# Patient Record
Sex: Female | Born: 1995 | Race: Black or African American | Hispanic: No | Marital: Single | Smoking: Never smoker
Health system: Southern US, Community
[De-identification: ages and names within clinical notes are randomized; demographics above are authoritative.]

## PROBLEM LIST (undated history)

## (undated) DIAGNOSIS — Z789 Other specified health status: Secondary | ICD-10-CM

---

## 2018-02-25 ENCOUNTER — Emergency Department (HOSPITAL_COMMUNITY)
Admission: EM | Admit: 2018-02-25 | Discharge: 2018-02-26 | Disposition: A | Discharge status: Home / Self Care | Payer: 59 | Source: Home / Self Care | Attending: Emergency Medicine | Admitting: Emergency Medicine

## 2018-02-25 ENCOUNTER — Emergency Department (HOSPITAL_COMMUNITY): Payer: 59

## 2018-02-25 ENCOUNTER — Other Ambulatory Visit: Payer: Self-pay

## 2018-02-25 ENCOUNTER — Encounter (HOSPITAL_COMMUNITY): Payer: Self-pay | Admitting: Emergency Medicine

## 2018-02-25 DIAGNOSIS — R109 Unspecified abdominal pain: Secondary | ICD-10-CM | POA: Insufficient documentation

## 2018-02-25 DIAGNOSIS — O209 Hemorrhage in early pregnancy, unspecified: Secondary | ICD-10-CM

## 2018-02-25 DIAGNOSIS — Z3A01 Less than 8 weeks gestation of pregnancy: Secondary | ICD-10-CM | POA: Insufficient documentation

## 2018-02-25 DIAGNOSIS — Q928 Other specified trisomies and partial trisomies of autosomes: Secondary | ICD-10-CM

## 2018-02-25 DIAGNOSIS — O021 Missed abortion: Secondary | ICD-10-CM | POA: Insufficient documentation

## 2018-02-25 DIAGNOSIS — O4691 Antepartum hemorrhage, unspecified, first trimester: Secondary | ICD-10-CM | POA: Diagnosis present

## 2018-02-25 DIAGNOSIS — O4690 Antepartum hemorrhage, unspecified, unspecified trimester: Secondary | ICD-10-CM

## 2018-02-25 LAB — WET PREP, GENITAL
Clue Cells Wet Prep HPF POC: NONE SEEN
Sperm: NONE SEEN
Trich, Wet Prep: NONE SEEN
Yeast Wet Prep HPF POC: NONE SEEN

## 2018-02-25 LAB — COMPREHENSIVE METABOLIC PANEL
ALT: 13 U/L (ref 0–44)
AST: 19 U/L (ref 15–41)
Albumin: 4 g/dL (ref 3.5–5.0)
Alkaline Phosphatase: 50 U/L (ref 38–126)
Anion gap: 9 (ref 5–15)
BILIRUBIN TOTAL: 0.3 mg/dL (ref 0.3–1.2)
BUN: 5 mg/dL — ABNORMAL LOW (ref 6–20)
CHLORIDE: 103 mmol/L (ref 98–111)
CO2: 24 mmol/L (ref 22–32)
CREATININE: 0.8 mg/dL (ref 0.44–1.00)
Calcium: 9.2 mg/dL (ref 8.9–10.3)
GFR calc Af Amer: >60 mL/min (ref >60)
Glucose, Bld: 84 mg/dL (ref 70–99)
Potassium: 3.9 mmol/L (ref 3.5–5.1)
Sodium: 136 mmol/L (ref 135–145)
TOTAL PROTEIN: 7.5 g/dL (ref 6.5–8.1)

## 2018-02-25 LAB — CBC WITH DIFFERENTIAL/PLATELET
ABS IMMATURE GRANULOCYTES: 0.01 10*3/uL (ref 0.00–0.07)
Basophils Absolute: 0 10*3/uL (ref 0.0–0.1)
Basophils Relative: 1 %
EOS PCT: 5 %
Eosinophils Absolute: 0.3 10*3/uL (ref 0.0–0.5)
HEMATOCRIT: 40.5 % (ref 36.0–46.0)
Hemoglobin: 12.8 g/dL (ref 12.0–15.0)
IMMATURE GRANULOCYTES: 0 %
LYMPHS PCT: 42 %
Lymphs Abs: 2.4 10*3/uL (ref 0.7–4.0)
MCH: 28.4 pg (ref 26.0–34.0)
MCHC: 31.6 g/dL (ref 30.0–36.0)
MCV: 90 fL (ref 80.0–100.0)
MONO ABS: 0.6 10*3/uL (ref 0.1–1.0)
Monocytes Relative: 10 %
Neutro Abs: 2.5 10*3/uL (ref 1.7–7.7)
Neutrophils Relative %: 42 %
Platelets: 255 10*3/uL (ref 150–400)
RBC: 4.5 MIL/uL (ref 3.87–5.11)
RDW: 12.4 % (ref 11.5–15.5)
WBC: 5.9 10*3/uL (ref 4.0–10.5)
nRBC: 0 % (ref 0.0–0.2)

## 2018-02-25 LAB — HCG, QUANTITATIVE, PREGNANCY: HCG, BETA CHAIN, QUANT, S: 13380 m[IU]/mL — AB (ref <5)

## 2018-02-25 NOTE — ED Provider Notes (Signed)
[facility] EMERGENCY DEPARTMENT Provider Note   CSN: [id number] Arrival date & time: 02/25/18  1853     History   Chief Complaint Chief Complaint  Patient presents with  . Vaginal Bleeding    HPI Jeanette [name] is a 22 y.o. female who is currently [redacted] weeks pregnant by menstrual cycle, who presents to ED for evaluation of abdominal cramping, vaginal bleeding for the past 5 days.  Patient was seen and evaluated on 10/11 at an outpatient clinic which confirmed her pregnancy with ultrasound.  She was told to follow-up with an OB/GYN at her 8-week mark.  However, since Monday she has had persistent vaginal bleeding, heavier than her usual..  She states that this is her first pregnancy.  Denies any anticoagulation, vomiting, fever, dysuria.    HPI  History reviewed. No pertinent past medical history.  There are no active problems to display for this patient.   History reviewed. No pertinent surgical history.   OB History    Gravida  1   Para      Term      Preterm      AB      Living        SAB      TAB      Ectopic      Multiple      Live Births               Home Medications    Prior to Admission medications   Medication Sig Start Date End Date Taking? Authorizing Provider  HYDROcodone-acetaminophen (NORCO/VICODIN) 5-325 MG tablet Take 1 tablet by mouth every 6 (six) hours as needed. 02/26/18   Dietrich Pates, PA-C    Family History No family history on file.  Social History Social History   Tobacco Use  . Smoking status: Never Smoker  . Smokeless tobacco: Never Used  Substance Use Topics  . Alcohol use: Never    Frequency: Never  . Drug use: Never     Allergies   Patient has no allergy information on record.   Review of Systems Review of Systems  Constitutional: Negative for appetite change, chills and fever.  HENT: Negative for ear pain, rhinorrhea, sneezing and sore throat.   Eyes: Negative for  photophobia and visual disturbance.  Respiratory: Negative for cough, chest tightness, shortness of breath and wheezing.   Cardiovascular: Negative for chest pain and palpitations.  Gastrointestinal: Positive for abdominal pain. Negative for blood in stool, constipation, diarrhea, nausea and vomiting.  Genitourinary: Positive for vaginal bleeding. Negative for dysuria, hematuria and urgency.  Musculoskeletal: Negative for myalgias.  Skin: Negative for rash.  Neurological: Negative for dizziness, weakness and light-headedness.     Physical Exam Updated Vital Signs BP 123/80 (BP Location: Right Arm)   Pulse 62   Temp 98.8 F (37.1 C) (Oral)   Resp 16   Ht 5\' 5"  (1.651 m)   Wt 68 kg   LMP 12/24/2017 (Approximate)   SpO2 100%   BMI 24.96 kg/m   Physical Exam  Constitutional: She appears well-developed and well-nourished. No distress.  HENT:  Head: Normocephalic and atraumatic.  Nose: Nose normal.  Eyes: Conjunctivae and EOM are normal. Left eye exhibits no discharge. No scleral icterus.  Neck: Normal range of motion. Neck supple.  Cardiovascular: Normal rate, regular rhythm, normal heart sounds and intact distal pulses. Exam reveals no gallop and no friction rub.  No murmur heard. Pulmonary/Chest: Effort normal and breath sounds normal. No  respiratory distress.  Abdominal: Soft. Bowel sounds are normal. She exhibits no distension. There is no tenderness. There is no guarding.  Genitourinary: There is bleeding in the vagina.  Genitourinary Comments: Pelvic exam: normal external genitalia without evidence of trauma. VULVA: normal appearing vulva with no masses, tenderness or lesion. VAGINA: normal appearing vagina with dark red blood in the vaginal vault. CERVIX: normal appearing cervix without lesions, cervical motion tenderness absent, cervical os closed with out purulent discharge; No vaginal discharge. Wet prep and DNA probe for chlamydia and GC obtained.   ADNEXA: normal  adnexa in size, nontender and no masses UTERUS: uterus is normal size, shape, consistency and nontender.    Musculoskeletal: Normal range of motion. She exhibits no edema.  Neurological: She is alert. She exhibits normal muscle tone. Coordination normal.  Skin: Skin is warm and dry. No rash noted.  Psychiatric: She has a normal mood and affect.  Nursing note and vitals reviewed.    ED Treatments / Results  Labs (all labs ordered are listed, but only abnormal results are displayed) Labs Reviewed  WET PREP, GENITAL - Abnormal; Notable for the following components:      Result Value   WBC, Wet Prep HPF POC RARE (*)    All other components within normal limits  COMPREHENSIVE METABOLIC PANEL - Abnormal; Notable for the following components:   BUN 5 (*)    All other components within normal limits  HCG, QUANTITATIVE, PREGNANCY - Abnormal; Notable for the following components:   hCG, Beta Chain, Quant, S 13,380 (*)    All other components within normal limits  URINALYSIS, ROUTINE W REFLEX MICROSCOPIC - Abnormal; Notable for the following components:   Color, Urine RED (*)    APPearance TURBID (*)    Glucose, UA   (*)    Value: TEST NOT REPORTED DUE TO COLOR INTERFERENCE OF URINE PIGMENT   Hgb urine dipstick   (*)    Value: TEST NOT REPORTED DUE TO COLOR INTERFERENCE OF URINE PIGMENT   Bilirubin Urine   (*)    Value: TEST NOT REPORTED DUE TO COLOR INTERFERENCE OF URINE PIGMENT   Ketones, ur   (*)    Value: TEST NOT REPORTED DUE TO COLOR INTERFERENCE OF URINE PIGMENT   Protein, ur   (*)    Value: TEST NOT REPORTED DUE TO COLOR INTERFERENCE OF URINE PIGMENT   Nitrite   (*)    Value: TEST NOT REPORTED DUE TO COLOR INTERFERENCE OF URINE PIGMENT   Leukocytes, UA   (*)    Value: TEST NOT REPORTED DUE TO COLOR INTERFERENCE OF URINE PIGMENT   All other components within normal limits  URINE CULTURE  CBC WITH DIFFERENTIAL/PLATELET  URINALYSIS, MICROSCOPIC (REFLEX)  ABO/RH  TYPE AND  SCREEN  GC/CHLAMYDIA PROBE AMP ([place]) NOT AT [facility]    EKG None  Radiology US Ob Less Than 14 Weeks With Ob Transvaginal  Result Date: 02/25/2018 CLINICAL DATA:  Vaginal bleeding with cramping EXAM: OBSTETRIC <14 WK [place] AND TRANSVAGINAL OB US TECHNIQUE: Both transabdominal and transvaginal ultrasound examinations were performed for complete evaluation of the gestation as well as the maternal uterus, adnexal regions, and pelvic cul-de-sac. Transvaginal technique was performed to assess early pregnancy. COMPARISON:  None. FINDINGS: Intrauterine gestational sac: Visible Yolk sac:  Visible Embryo:  Visible Cardiac Activity: Not visible CRL: 7 mm   6 w   4 d Subchorionic hemorrhage:  None visualized. Maternal uterus/adnexae: Ovaries are within normal limits. The left ovary measures 3.3  x 1.5 by 2 cm. The right ovary measures 3.5 by 3.2 x 2 cm. No significant free fluid. IMPRESSION: Single intrauterine pregnancy with average crown-rump length of 7 mm and no documented cardiac activity. Findings meet definitive criteria for failed pregnancy. This follows SRU consensus guidelines: Diagnostic Criteria for Nonviable Pregnancy Early in the First Trimester. [facility] [phone]. Electronically Signed   By: Jasmine Pang M.D.   On: 02/25/2018 23:46    Procedures Procedures (including critical care time)  Medications Ordered in ED Medications  HYDROcodone-acetaminophen (NORCO/VICODIN) 5-325 MG per tablet 1 tablet (has no administration in time range)     Initial Impression / Assessment and Plan / ED Course  I have reviewed the triage vital signs and the nursing notes.  Pertinent labs & imaging results that were available during my care of the patient were reviewed by me and considered in my medical decision making (see chart for details).  Clinical Course as of Feb 27 56  Fri Feb 26, 2018  0028 Spoke to Dr. Vergie Living, OB/GYN.  He recommends obtaining blood type for RhoGam administration  if needed.  Otherwise, patient will need to follow-up at the [facility].  Will send chart so that he will set up an appointment.   [HK]    Clinical Course User Index [HK] Dietrich Pates, PA-C    22 year old female who is currently [redacted] weeks pregnant presents to ED for 5-day history of lower abdominal cramping, abnormal vaginal bleeding.  States that she has had bleeding that is heavier than her usual.  For the past 5 days.  She had ultrasound done at an outpatient clinic on 10/11 which confirmed a pregnancy.  Ever, she has not yet established care with an OB/GYN.  This is her first pregnancy.  On exam patient is overall well.  She denies any lightheadedness, shortness of breath or syncope.  No abdominal tenderness to palpation noted.  Pelvic exam revealed a closed cervical office with dark red blood in the vaginal vault.  Lab work significant for hemoglobin hematocrit of 12.6 and 40.  hCG elevated at 13,000.  Urinalysis inconclusive secondary to bleeding.  Culture obtained.  Wet prep shows WBCs with no other abnormalities.  Spoke to Dr. Vergie Living at the [facility].  He requests obtaining blood type for possible RhoGam administration.  If not warranted, will advise her to establish care with an OB/GYN for follow-up tomorrow, to discuss options.  I will send her chart to him in the meantime. Care handed off to oncoming provider, S. Upstill PA-C pending Rhogam administration if needed. Pain medication given prn. [place] PMP queried with no discrepancies.  Portions of this note were generated with Scientist, clinical (histocompatibility and immunogenetics). Dictation errors may occur despite best attempts at proofreading.   Final Clinical Impressions(s) / ED Diagnoses   Final diagnoses:  Vaginal bleeding in pregnancy    ED Discharge Orders         Ordered    HYDROcodone-acetaminophen (NORCO/VICODIN) 5-325 MG tablet  Every 6 hours PRN     02/26/18 0053           Dietrich Pates, PA-C 02/26/18 0057    Tegeler, Canary Brim,  MD 02/26/18 [phone]

## 2018-02-25 NOTE — ED Triage Notes (Signed)
Pt st's she is 6 weeks preg and started having vag. Bleeding and cramping 4 days ago.  Pt also st's she is passing clots

## 2018-02-25 NOTE — ED Notes (Signed)
Pt. To U/S via stretcher.

## 2018-02-26 ENCOUNTER — Encounter (HOSPITAL_COMMUNITY)
Admission: AD | Disposition: A | Discharge status: Home / Self Care | Payer: Self-pay | Source: Ambulatory Visit | Attending: Obstetrics and Gynecology

## 2018-02-26 ENCOUNTER — Encounter (HOSPITAL_COMMUNITY): Payer: Self-pay

## 2018-02-26 ENCOUNTER — Other Ambulatory Visit: Payer: Self-pay

## 2018-02-26 ENCOUNTER — Telehealth: Payer: Self-pay | Admitting: Obstetrics and Gynecology

## 2018-02-26 ENCOUNTER — Ambulatory Visit (HOSPITAL_COMMUNITY): Payer: 59 | Admitting: Anesthesiology

## 2018-02-26 ENCOUNTER — Ambulatory Visit (HOSPITAL_COMMUNITY)
Admission: AD | Admit: 2018-02-26 | Discharge: 2018-02-26 | Disposition: A | Discharge status: Home / Self Care | Payer: 59 | Source: Ambulatory Visit | Attending: Obstetrics and Gynecology | Admitting: Obstetrics and Gynecology

## 2018-02-26 DIAGNOSIS — Z9889 Other specified postprocedural states: Secondary | ICD-10-CM

## 2018-02-26 DIAGNOSIS — O021 Missed abortion: Secondary | ICD-10-CM | POA: Insufficient documentation

## 2018-02-26 DIAGNOSIS — O200 Threatened abortion: Secondary | ICD-10-CM

## 2018-02-26 HISTORY — DX: Other specified health status: Z78.9

## 2018-02-26 HISTORY — PX: DILATION AND EVACUATION: SHX1459

## 2018-02-26 LAB — ABO/RH: ABO/RH(D): O POS

## 2018-02-26 LAB — URINALYSIS, MICROSCOPIC (REFLEX)
Bacteria, UA: NONE SEEN
RBC / HPF: >50 RBC/hpf (ref 0–5)
WBC, UA: NONE SEEN WBC/hpf (ref 0–5)

## 2018-02-26 LAB — URINALYSIS, ROUTINE W REFLEX MICROSCOPIC

## 2018-02-26 LAB — TYPE AND SCREEN
ABO/RH(D): O POS
ANTIBODY SCREEN: NEGATIVE

## 2018-02-26 LAB — GC/CHLAMYDIA PROBE AMP (~~LOC~~) NOT AT ARMC
Chlamydia: POSITIVE — AB
Neisseria Gonorrhea: NEGATIVE

## 2018-02-26 SURGERY — DILATION AND EVACUATION, UTERUS
Anesthesia: Monitor Anesthesia Care

## 2018-02-26 MED ORDER — PROPOFOL 10 MG/ML IV BOLUS
INTRAVENOUS | Status: DC | PRN
  Administered 2018-02-26: 70 mg via INTRAVENOUS
  Administered 2018-02-26: 20 mg via INTRAVENOUS

## 2018-02-26 MED ORDER — CHLOROPROCAINE HCL 1 % IJ SOLN
INTRAMUSCULAR | Status: DC | PRN
  Administered 2018-02-26: 10 mL

## 2018-02-26 MED ORDER — DOXYCYCLINE HYCLATE 100 MG IV SOLR
200.0000 mg | INTRAVENOUS | Status: AC
  Administered 2018-02-26: 200 mg via INTRAVENOUS
  Filled 2018-02-26: qty 200

## 2018-02-26 MED ORDER — IBUPROFEN 600 MG PO TABS: 600.0000 mg | ORAL_TABLET | Freq: Four times a day (QID) | ORAL | 3 refills | Status: AC | PRN

## 2018-02-26 MED ORDER — PROPOFOL 500 MG/50ML IV EMUL
INTRAVENOUS | Status: DC | PRN
  Administered 2018-02-26: 50 ug/kg/min via INTRAVENOUS

## 2018-02-26 MED ORDER — KETOROLAC TROMETHAMINE 30 MG/ML IJ SOLN
INTRAMUSCULAR | Status: AC
  Filled 2018-02-26: qty 1

## 2018-02-26 MED ORDER — ONDANSETRON HCL 4 MG/2ML IJ SOLN
INTRAMUSCULAR | Status: AC
  Filled 2018-02-26: qty 2

## 2018-02-26 MED ORDER — HYDROCODONE-ACETAMINOPHEN 5-325 MG PO TABS
1.0000 | ORAL_TABLET | Freq: Once | ORAL | Status: AC
  Administered 2018-02-26: 1 via ORAL
  Filled 2018-02-26: qty 1

## 2018-02-26 MED ORDER — ONDANSETRON HCL 4 MG/2ML IJ SOLN
INTRAMUSCULAR | Status: DC | PRN
  Administered 2018-02-26: 4 mg via INTRAVENOUS

## 2018-02-26 MED ORDER — KETOROLAC TROMETHAMINE 30 MG/ML IJ SOLN
INTRAMUSCULAR | Status: DC | PRN
  Administered 2018-02-26: 30 mg via INTRAVENOUS

## 2018-02-26 MED ORDER — LACTATED RINGERS IV SOLN
INTRAVENOUS | Status: DC
  Administered 2018-02-26: 125 mL/h via INTRAVENOUS

## 2018-02-26 MED ORDER — HYDROCODONE-ACETAMINOPHEN 5-325 MG PO TABS: 1.0000 | ORAL_TABLET | Freq: Four times a day (QID) | ORAL | 0 refills | Status: AC | PRN

## 2018-02-26 MED ORDER — PROPOFOL 10 MG/ML IV BOLUS
INTRAVENOUS | Status: AC
  Filled 2018-02-26: qty 20

## 2018-02-26 MED ORDER — FENTANYL CITRATE (PF) 100 MCG/2ML IJ SOLN
INTRAMUSCULAR | Status: DC | PRN
  Administered 2018-02-26: 100 ug via INTRAVENOUS

## 2018-02-26 MED ORDER — LIDOCAINE HCL (CARDIAC) PF 100 MG/5ML IV SOSY
PREFILLED_SYRINGE | INTRAVENOUS | Status: DC | PRN
  Administered 2018-02-26: 100 mg via INTRAVENOUS

## 2018-02-26 MED ORDER — MIDAZOLAM HCL 2 MG/2ML IJ SOLN
INTRAMUSCULAR | Status: AC
  Filled 2018-02-26: qty 2

## 2018-02-26 MED ORDER — MIDAZOLAM HCL 2 MG/2ML IJ SOLN
INTRAMUSCULAR | Status: DC | PRN
  Administered 2018-02-26: 2 mg via INTRAVENOUS

## 2018-02-26 MED ORDER — SCOPOLAMINE 1 MG/3DAYS TD PT72
1.0000 | MEDICATED_PATCH | Freq: Once | TRANSDERMAL | Status: DC
  Administered 2018-02-26: 1.5 mg via TRANSDERMAL

## 2018-02-26 MED ORDER — DEXAMETHASONE SODIUM PHOSPHATE 4 MG/ML IJ SOLN
INTRAMUSCULAR | Status: AC
  Filled 2018-02-26: qty 1

## 2018-02-26 MED ORDER — FENTANYL CITRATE (PF) 100 MCG/2ML IJ SOLN
INTRAMUSCULAR | Status: AC
  Filled 2018-02-26: qty 2

## 2018-02-26 MED ORDER — SCOPOLAMINE 1 MG/3DAYS TD PT72
MEDICATED_PATCH | TRANSDERMAL | Status: AC
  Administered 2018-02-26: 1.5 mg via TRANSDERMAL
  Filled 2018-02-26: qty 1

## 2018-02-26 MED ORDER — DEXAMETHASONE SODIUM PHOSPHATE 10 MG/ML IJ SOLN
INTRAMUSCULAR | Status: DC | PRN
  Administered 2018-02-26: 4 mg via INTRAVENOUS

## 2018-02-26 MED ORDER — LIDOCAINE HCL (CARDIAC) PF 100 MG/5ML IV SOSY
PREFILLED_SYRINGE | INTRAVENOUS | Status: AC
  Filled 2018-02-26: qty 5

## 2018-02-26 SURGICAL SUPPLY — 20 items
CATH ROBINSON RED A/P 16FR (CATHETERS) ×3 IMPLANT
DECANTER SPIKE VIAL GLASS SM (MISCELLANEOUS) ×3 IMPLANT
GLOVE BIOGEL PI IND STRL 6.5 (GLOVE) ×1 IMPLANT
GLOVE BIOGEL PI IND STRL 7.0 (GLOVE) ×1 IMPLANT
GLOVE BIOGEL PI INDICATOR 6.5 (GLOVE) ×2
GLOVE BIOGEL PI INDICATOR 7.0 (GLOVE) ×2
GLOVE SURG SS PI 6.0 STRL IVOR (GLOVE) ×3 IMPLANT
GOWN STRL REUS W/TWL LRG LVL3 (GOWN DISPOSABLE) ×6 IMPLANT
KIT BERKELEY 1ST TRIMESTER 3/8 (MISCELLANEOUS) ×3 IMPLANT
NS IRRIG 1000ML POUR BTL (IV SOLUTION) ×3 IMPLANT
PACK VAGINAL MINOR WOMEN LF (CUSTOM PROCEDURE TRAY) ×3 IMPLANT
PAD OB MATERNITY 4.3X12.25 (PERSONAL CARE ITEMS) ×3 IMPLANT
PAD PREP 24X48 CUFFED NSTRL (MISCELLANEOUS) ×3 IMPLANT
SET BERKELEY SUCTION TUBING (SUCTIONS) ×3 IMPLANT
TOWEL OR 17X24 6PK STRL BLUE (TOWEL DISPOSABLE) ×6 IMPLANT
VACURETTE 10 RIGID CVD (CANNULA) IMPLANT
VACURETTE 6 ASPIR F TIP BERK (CANNULA) IMPLANT
VACURETTE 7MM CVD STRL WRAP (CANNULA) IMPLANT
VACURETTE 8 RIGID CVD (CANNULA) IMPLANT
VACURETTE 9 RIGID CVD (CANNULA) IMPLANT

## 2018-02-26 NOTE — Transfer of Care (Signed)
Immediate Anesthesia Transfer of Care Note  Patient: Jeanette [name]  Procedure(s) Performed: DILATATION AND EVACUATION (N/A )  Patient Location: PACU  Anesthesia Type:MAC  Level of Consciousness: awake, alert  and oriented  Airway & Oxygen Therapy: Patient Spontanous Breathing and Patient connected to nasal cannula oxygen  Post-op Assessment: Report given to RN, Post -op Vital signs reviewed and stable and Patient moving all extremities  Post vital signs: Reviewed and stable  Last Vitals:  Vitals Value Taken Time  BP    Temp    Pulse    Resp    SpO2      Last Pain:  Vitals:   02/26/18 1001  TempSrc: Oral  PainSc: 5       Patients Stated Pain Goal: 2 (18/84/16 6063)  Complications: [name] apparent anesthesia complications

## 2018-02-26 NOTE — Op Note (Signed)
Jeanette [name] PROCEDURE DATE: 02/26/2018  PREOPERATIVE DIAGNOSIS: 6 week missed abortion. POSTOPERATIVE DIAGNOSIS: The same. PROCEDURE:     Dilation and Evacuation. SURGEON:  Dr. Catalina Antigua  INDICATIONS: 22 y.o. G1P0 with MAB at [redacted] weeks gestation, needing surgical completion.  Risks of surgery were discussed with the patient including but not limited to: bleeding which may require transfusion; infection which may require antibiotics; injury to uterus or surrounding organs;need for additional procedures including laparotomy or laparoscopy; possibility of intrauterine scarring which may impair future fertility; and other postoperative/anesthesia complications. Written informed consent was obtained.    FINDINGS:  An 8-week size anteverted uterus, moderate amounts of products of conception, specimen sent to pathology.  ANESTHESIA:    Monitored intravenous sedation, paracervical block. INTRAVENOUS FLUIDS:  600 ml of LR ESTIMATED BLOOD LOSS:  Less than 20 ml. SPECIMENS:  Products of conception sent to pathology COMPLICATIONS:  None immediate.  PROCEDURE DETAILS:  The patient received intravenous antibiotics while in the preoperative area.  She was then taken to the operating room where general anesthesia was administered and was found to be adequate.  After an adequate timeout was performed, she was placed in the dorsal lithotomy position and examined; then prepped and draped in the sterile manner.   Her bladder was catheterized for an unmeasured amount of clear, yellow urine. A vaginal speculum was then placed in the patient's vagina and a single tooth tenaculum was applied to the anterior lip of the cervix.  A paracervical block using 0.5% Marcaine was administered. The cervix was gently dilated to accommodate a 7 mm suction curette that was gently advanced to the uterine fundus.  The suction device was then activated and curette slowly rotated to clear the uterus of products of conception.   A sharp curettage was then performed to confirm complete emptying of the uterus. There was minimal bleeding noted and the tenaculum removed with good hemostasis noted.   All instruments were removed from the patient's vagina. The patient tolerated the procedure well and was taken to the recovery area awake, and in stable condition.  The patient will be discharged to home as per PACU criteria.  Routine postoperative instructions given.  She was prescribed Percocet, Ibuprofen and Colace.  She will follow up in the clinic in 2-weeks for postoperative evaluation.

## 2018-02-26 NOTE — Anesthesia Postprocedure Evaluation (Signed)
Anesthesia Post Note  Patient: Jeanette [name]  Procedure(s) Performed: DILATATION AND EVACUATION (N/A )     Patient location during evaluation: PACU Anesthesia Type: MAC Level of consciousness: awake and alert Pain management: pain level controlled Vital Signs Assessment: post-procedure vital signs reviewed and stable Respiratory status: spontaneous breathing, nonlabored ventilation, respiratory function stable and patient connected to nasal cannula oxygen Cardiovascular status: stable and blood pressure returned to baseline Postop Assessment: no apparent nausea or vomiting Anesthetic complications: no    Last Vitals:  Vitals:   02/26/18 1157 02/26/18 1230  BP: 124/66 119/61  Pulse: 64 69  Resp: 15 18  Temp:    SpO2: 100% 100%    Last Pain:  Vitals:   02/26/18 1157  TempSrc:   PainSc: 0-No pain   Pain Goal: Patients Stated Pain Goal: 2 (02/26/18 1001)               Tiajuana Amass

## 2018-02-26 NOTE — Anesthesia Preprocedure Evaluation (Signed)
Anesthesia Evaluation  Patient identified by MRN, date of birth, ID band Patient awake    Reviewed: Allergy & Precautions, NPO status , Patient's Chart, lab work & pertinent test results  Airway Mallampati: II  TM Distance: >3 FB Neck ROM: Full    Dental  (+) Dental Advisory Given   Pulmonary neg pulmonary ROS,    breath sounds clear to auscultation       Cardiovascular negative cardio ROS   Rhythm:Regular Rate:Normal     Neuro/Psych negative neurological ROS     GI/Hepatic negative GI ROS, Neg liver ROS,   Endo/Other  negative endocrine ROS  Renal/GU negative Renal ROS     Musculoskeletal   Abdominal   Peds  Hematology negative hematology ROS (+)   Anesthesia Other Findings   Reproductive/Obstetrics Missed AB                             Lab Results  Component Value Date   WBC 5.9 02/25/2018   HGB 12.8 02/25/2018   HCT 40.5 02/25/2018   MCV 90.0 02/25/2018   PLT 255 02/25/2018   Lab Results  Component Value Date   CREATININE 0.80 02/25/2018   BUN 5 (L) 02/25/2018   NA 136 02/25/2018   K 3.9 02/25/2018   CL 103 02/25/2018   CO2 24 02/25/2018    Anesthesia Physical Anesthesia Plan  ASA: I  Anesthesia Plan: MAC   Post-op Pain Management:    Induction: Intravenous  PONV Risk Score and Plan: 2 and Ondansetron, Propofol infusion and Treatment may vary due to age or medical condition  Airway Management Planned: Natural Airway and Simple Face Mask  Additional Equipment:   Intra-op Plan:   Post-operative Plan:   Informed Consent: I have reviewed the patients History and Physical, chart, labs and discussed the procedure including the risks, benefits and alternatives for the proposed anesthesia with the patient or authorized representative who has indicated his/her understanding and acceptance.     Plan Discussed with: CRNA  Anesthesia Plan Comments:          Anesthesia Quick Evaluation

## 2018-02-26 NOTE — Discharge Instructions (Addendum)
The OB/GYN will contact you regarding an appointment. If you start to saturate a pad in 2 to 3 hours, or your symptoms worsen, or if you have lightheadedness or feeling like you are going to pass out, you will need to follow-up at the [facility].

## 2018-02-26 NOTE — Telephone Encounter (Signed)
GYN Telephone Note Patient called at [phone] and she confirms her DOB. I told her I was told about her situation in the ED last night and I wanted to talk to her about what's going on and about setting up a plan since it may take some time to set her up for a clinic visit since it's Friday. I told her about medical vs surgical options for her SAB and she'd like to do surgery. I told her to stay NPO and be watching for a phone call from [place] about possibly doing it today or tomorrow. Pt amenable to plan.  Cornelia Copa MD Attending Center for [employer] (Faculty Practice) 02/26/2018 Time: [phone]

## 2018-02-26 NOTE — H&P (Signed)
Jeanette [name] is an 22 y.o. female G1P0 diagnosed with a missed abortion who is here for surgical evacuation. Patient reports vaginal bleeding for the past week with some cramping pain. She denies any fever or chills  Pertinent Gynecological History: Menses: regular every month without intermenstrual spotting Contraception: none DES exposure: denies Blood transfusions: none OB History: G1, P0   Menstrual History: Patient's last menstrual period was 12/24/2017 (approximate).    Past Medical History:  Diagnosis Date  . Medical history non-contributory     History reviewed. No pertinent surgical history.  History reviewed. No pertinent family history.  Social History:  reports that she has never smoked. She has never used smokeless tobacco. She reports that she does not drink alcohol or use drugs.  Allergies: No Known Allergies  Medications Prior to Admission  Medication Sig Dispense Refill Last Dose  . HYDROcodone-acetaminophen (NORCO/VICODIN) 5-325 MG tablet Take 1 tablet by mouth every 6 (six) hours as needed. 6 tablet 0 02/26/2018 at 0100    ROS See pertinent in HPI Blood pressure 119/78, pulse 86, temperature 98.6 F (37 C), temperature source Oral, resp. rate 20, last menstrual period 12/24/2017, SpO2 100 %. Physical Exam GENERAL: Well-developed, well-nourished female in no acute distress.  LUNGS: Clear to auscultation bilaterally.  HEART: Regular rate and rhythm. ABDOMEN: Soft, nontender, nondistended. No organomegaly. PELVIC: Deferred to OR EXTREMITIES: No cyanosis, clubbing, or edema, 2+ distal pulses.  Results for orders placed or performed during the hospital encounter of 02/25/18 (from the past 24 hour(s))  CBC with Differential     Status: None   Collection Time: 02/25/18  8:19 PM  Result Value Ref Range   WBC 5.9 4.0 - 10.5 K/uL   RBC 4.50 3.87 - 5.11 MIL/uL   Hemoglobin 12.8 12.0 - 15.0 g/dL   HCT 16.1 09.6 - 04.5 %   MCV 90.0 80.0 - 100.0 fL   MCH 28.4 26.0 - 34.0 pg   MCHC 31.6 30.0 - 36.0 g/dL   RDW 40.9 81.1 - 91.4 %   Platelets 255 150 - 400 K/uL   nRBC 0.0 0.0 - 0.2 %   Neutrophils Relative % 42 %   Neutro Abs 2.5 1.7 - 7.7 K/uL   Lymphocytes Relative 42 %   Lymphs Abs 2.4 0.7 - 4.0 K/uL   Monocytes Relative 10 %   Monocytes Absolute 0.6 0.1 - 1.0 K/uL   Eosinophils Relative 5 %   Eosinophils Absolute 0.3 0.0 - 0.5 K/uL   Basophils Relative 1 %   Basophils Absolute 0.0 0.0 - 0.1 K/uL   Immature Granulocytes 0 %   Abs Immature Granulocytes 0.01 0.00 - 0.07 K/uL  Comprehensive metabolic panel     Status: Abnormal   Collection Time: 02/25/18  8:19 PM  Result Value Ref Range   Sodium 136 135 - 145 mmol/L   Potassium 3.9 3.5 - 5.1 mmol/L   Chloride 103 98 - 111 mmol/L   CO2 24 22 - 32 mmol/L   Glucose, Bld 84 70 - 99 mg/dL   BUN 5 (L) 6 - 20 mg/dL   Creatinine, Ser 7.82 0.44 - 1.00 mg/dL   Calcium 9.2 8.9 - 95.6 mg/dL   Total Protein 7.5 6.5 - 8.1 g/dL   Albumin 4.0 3.5 - 5.0 g/dL   AST 19 15 - 41 U/L   ALT 13 0 - 44 U/L   Alkaline Phosphatase 50 38 - 126 U/L   Total Bilirubin 0.3 0.3 - 1.2 mg/dL   GFR  calc non Af Amer >60 >60 mL/min   GFR calc Af Amer >60 >60 mL/min   Anion gap 9 5 - 15  hCG, quantitative, pregnancy     Status: Abnormal   Collection Time: 02/25/18  8:45 PM  Result Value Ref Range   hCG, Beta Chain, Quant, S 13,380 (H) <5 mIU/mL  Wet prep, genital     Status: Abnormal   Collection Time: 02/25/18  9:58 PM  Result Value Ref Range   Yeast Wet Prep HPF POC NONE SEEN NONE SEEN   Trich, Wet Prep NONE SEEN NONE SEEN   Clue Cells Wet Prep HPF POC NONE SEEN NONE SEEN   WBC, Wet Prep HPF POC RARE (A) NONE SEEN   Sperm NONE SEEN   Urinalysis, Routine w reflex microscopic     Status: Abnormal   Collection Time: 02/25/18 11:47 PM  Result Value Ref Range   Color, Urine RED (A) YELLOW   APPearance TURBID (A) CLEAR   Specific Gravity, Urine  1.005 - 1.030    TEST NOT REPORTED DUE TO COLOR  INTERFERENCE OF URINE PIGMENT   pH  5.0 - 8.0    TEST NOT REPORTED DUE TO COLOR INTERFERENCE OF URINE PIGMENT   Glucose, UA (A) NEGATIVE mg/dL    TEST NOT REPORTED DUE TO COLOR INTERFERENCE OF URINE PIGMENT   Hgb urine dipstick (A) NEGATIVE    TEST NOT REPORTED DUE TO COLOR INTERFERENCE OF URINE PIGMENT   Bilirubin Urine (A) NEGATIVE    TEST NOT REPORTED DUE TO COLOR INTERFERENCE OF URINE PIGMENT   Ketones, ur (A) NEGATIVE mg/dL    TEST NOT REPORTED DUE TO COLOR INTERFERENCE OF URINE PIGMENT   Protein, ur (A) NEGATIVE mg/dL    TEST NOT REPORTED DUE TO COLOR INTERFERENCE OF URINE PIGMENT   Nitrite (A) NEGATIVE    TEST NOT REPORTED DUE TO COLOR INTERFERENCE OF URINE PIGMENT   Leukocytes, UA (A) NEGATIVE    TEST NOT REPORTED DUE TO COLOR INTERFERENCE OF URINE PIGMENT  Urinalysis, Microscopic (reflex)     Status: None   Collection Time: 02/25/18 11:47 PM  Result Value Ref Range   RBC / HPF >50 0 - 5 RBC/hpf   WBC, UA NONE SEEN 0 - 5 WBC/hpf   Bacteria, UA NONE SEEN NONE SEEN   Squamous Epithelial / LPF 0-5 0 - 5  ABO/Rh     Status: None   Collection Time: 02/26/18 12:47 AM  Result Value Ref Range   ABO/RH(D) O POS    No rh immune globuloin      NOT A RH IMMUNE GLOBULIN CANDIDATE, PT RH POSITIVE Performed at [facility], [address], [place], [place] [address]   Type and screen [facility]     Status: None   Collection Time: 02/26/18 12:47 AM  Result Value Ref Range   ABO/RH(D) O POS    Antibody Screen NEG    Sample Expiration      03/01/2018 Performed at [facility], [address] [place], [place] [address]     US Ob Less Than 14 Weeks With Ob Transvaginal  Result Date: 02/25/2018 CLINICAL DATA:  Vaginal bleeding with cramping EXAM: OBSTETRIC <14 WK [place] AND TRANSVAGINAL OB US TECHNIQUE: Both transabdominal and transvaginal ultrasound examinations were performed for complete evaluation of the gestation as well as the maternal uterus, adnexal  regions, and pelvic cul-de-sac. Transvaginal technique was performed to assess early pregnancy. COMPARISON:  None. FINDINGS: Intrauterine  gestational sac: Visible Yolk sac:  Visible Embryo:  Visible Cardiac Activity: Not visible CRL: 7 mm   6 w   4 d Subchorionic hemorrhage:  None visualized. Maternal uterus/adnexae: Ovaries are within normal limits. The left ovary measures 3.3 x 1.5 by 2 cm. The right ovary measures 3.5 by 3.2 x 2 cm. No significant free fluid. IMPRESSION: Single intrauterine pregnancy with average crown-rump length of 7 mm and no documented cardiac activity. Findings meet definitive criteria for failed pregnancy. This follows SRU consensus guidelines: Diagnostic Criteria for Nonviable Pregnancy Early in the First Trimester. [facility] [phone]. Electronically Signed   By: Jasmine Pang M.D.   On: 02/25/2018 23:46    Assessment/Plan: 22 yo G1P0 with missed abortion here for D&E - Risks, benefits and alternatives were explained including but not limited to risks of bleeding, infection, uterine performation and damage to adjacent organs - Patient verbalized understanding and all questions were answered  Ledger Heindl 02/26/2018, 10:16 AM

## 2018-02-26 NOTE — Discharge Instructions (Signed)
Contraception Choices Contraception, also called birth control, refers to methods or devices that prevent pregnancy. Hormonal methods Contraceptive implant A contraceptive implant is a thin, plastic tube that contains a hormone. It is inserted into the upper part of the arm. It can remain in place for up to 3 years. Progestin-only injections Progestin-only injections are injections of progestin, a synthetic form of the hormone progesterone. They are given every 3 months by a health care provider. Birth control pills Birth control pills are pills that contain hormones that prevent pregnancy. They must be taken once a day, preferably at the same time each day. Birth control patch The birth control patch contains hormones that prevent pregnancy. It is placed on the skin and must be changed once a week for three weeks and removed on the fourth week. A prescription is needed to use this method of contraception. Vaginal ring A vaginal ring contains hormones that prevent pregnancy. It is placed in the vagina for three weeks and removed on the fourth week. After that, the process is repeated with a new ring. A prescription is needed to use this method of contraception. Emergency contraceptive Emergency contraceptives prevent pregnancy after unprotected sex. They come in pill form and can be taken up to 5 days after sex. They work best the sooner they are taken after having sex. Most emergency contraceptives are available without a prescription. This method should not be used as your only form of birth control. Barrier methods Female condom A female condom is a thin sheath that is worn over the penis during sex. Condoms keep sperm from going inside a woman's body. They can be used with a spermicide to increase their effectiveness. They should be disposed after a single use. Female condom A female condom is a soft, loose-fitting sheath that is put into the vagina before sex. The condom keeps sperm from going  inside a woman's body. They should be disposed after a single use. Diaphragm A diaphragm is a soft, dome-shaped barrier. It is inserted into the vagina before sex, along with a spermicide. The diaphragm blocks sperm from entering the uterus, and the spermicide kills sperm. A diaphragm should be left in the vagina for 6-8 hours after sex and removed within 24 hours. A diaphragm is prescribed and fitted by a health care provider. A diaphragm should be replaced every 1-2 years, after giving birth, after gaining more than 15 lb (6.8 kg), and after pelvic surgery. Cervical cap A cervical cap is a round, soft latex or plastic cup that fits over the cervix. It is inserted into the vagina before sex, along with spermicide. It blocks sperm from entering the uterus. The cap should be left in place for 6-8 hours after sex and removed within 48 hours. A cervical cap must be prescribed and fitted by a health care provider. It should be replaced every 2 years. Sponge A sponge is a soft, circular piece of polyurethane foam with spermicide on it. The sponge helps block sperm from entering the uterus, and the spermicide kills sperm. To use it, you make it wet and then insert it into the vagina. It should be inserted before sex, left in for at least 6 hours after sex, and removed and thrown away within 30 hours. Spermicides Spermicides are chemicals that kill or block sperm from entering the cervix and uterus. They can come as a cream, jelly, suppository, foam, or tablet. A spermicide should be inserted into the vagina with an applicator at least 10-27 minutes before  sex to allow time for it to work. The process must be repeated every time you have sex. Spermicides do not require a prescription. Intrauterine contraception Intrauterine device (IUD) An IUD is a T-shaped device that is put in a woman's uterus. There are two types:  Hormone IUD.This type contains progestin, a synthetic form of the hormone progesterone. This  type can stay in place for 3-5 years.  Copper IUD.This type is wrapped in copper wire. It can stay in place for 10 years.  Permanent methods of contraception Female tubal ligation In this method, a woman's fallopian tubes are sealed, tied, or blocked during surgery to prevent eggs from traveling to the uterus. Hysteroscopic sterilization In this method, a small, flexible insert is placed into each fallopian tube. The inserts cause scar tissue to form in the fallopian tubes and block them, so sperm cannot reach an egg. The procedure takes about 3 months to be effective. Another form of birth control must be used during those 3 months. Female sterilization This is a procedure to tie off the tubes that carry sperm (vasectomy). After the procedure, the man can still ejaculate fluid (semen). Natural planning methods Natural family planning In this method, a couple does not have sex on days when the woman could become pregnant. Calendar method This means keeping track of the length of each menstrual cycle, identifying the days when pregnancy can happen, and not having sex on those days. Ovulation method In this method, a couple avoids sex during ovulation. Symptothermal method This method involves not having sex during ovulation. The woman typically checks for ovulation by watching changes in her temperature and in the consistency of cervical mucus. Post-ovulation method In this method, a couple waits to have sex until after ovulation. Summary  Contraception, also called birth control, means methods or devices that prevent pregnancy.  Hormonal methods of contraception include implants, injections, pills, patches, vaginal rings, and emergency contraceptives.  Barrier methods of contraception can include female condoms, female condoms, diaphragms, cervical caps, sponges, and spermicides.  There are two types of IUDs (intrauterine devices). An IUD can be put in a woman's uterus to prevent pregnancy  for 3-5 years.  Permanent sterilization can be done through a procedure for males, females, or both.  Natural family planning methods involve not having sex on days when the woman could become pregnant. This information is not intended to replace advice given to you by your health care provider. Make sure you discuss any questions you have with your health care provider. Document Released: 04/28/2005 Document Revised: 05/31/2016 Document Reviewed: 05/31/2016 Elsevier Interactive Patient Education  2018 [employer]  Dilation and Curettage or Vacuum Curettage Dilation and curettage (D&C) and vacuum curettage are minor procedures. A D&C involves stretching (dilation) the cervix and scraping (curettage) the inside lining of the uterus (endometrium). During a D&C, tissue is gently scraped from the endometrium, starting from the top portion of the uterus down to the lowest part of the uterus (cervix). During a vacuum curettage, the lining and tissue in the uterus are removed with the use of gentle suction. Curettage may be performed to either diagnose or treat a problem. As a diagnostic procedure, curettage is performed to examine tissues from the uterus. A diagnostic curettage may be done if you have:  Irregular bleeding in the uterus.  Bleeding with the development of clots.  Spotting between menstrual periods.  Prolonged menstrual periods or other abnormal bleeding.  Bleeding after menopause.  No menstrual period (amenorrhea).  A change  in size and shape of the uterus.  Abnormal endometrial cells discovered during a Pap test.  As a treatment procedure, curettage may be performed for the following reasons:  Removal of an IUD (intrauterine device).  Removal of retained placenta after giving birth.  Abortion.  Miscarriage.  Removal of endometrial polyps.  Removal of uncommon types of noncancerous lumps (fibroids).  Tell a health care provider about:  Any allergies you have,  including allergies to prescribed medicine or latex.  All medicines you are taking, including vitamins, herbs, eye drops, creams, and over-the-counter medicines. This is especially important if you take any blood-thinning medicine. Bring a list of all of your medicines to your appointment.  Any problems you or family members have had with anesthetic medicines.  Any blood disorders you have.  Any surgeries you have had.  Your medical history and any medical conditions you have.  Whether you are pregnant or may be pregnant.  Recent vaginal infections you have had.  Recent menstrual periods, bleeding problems you have had, and what form of birth control (contraception) you use. What are the risks? Generally, this is a safe procedure. However, problems may occur, including:  Infection.  Heavy vaginal bleeding.  Allergic reactions to medicines.  Damage to the cervix or other structures or organs.  Development of scar tissue (adhesions) inside the uterus, which can cause abnormal amounts of menstrual bleeding. This may make it harder to get pregnant in the future.  A hole (perforation) or puncture in the uterine wall. This is rare.  What happens before the procedure? Staying hydrated Follow instructions from your health care provider about hydration, which may include:  Up to 2 hours before the procedure - you may continue to drink clear liquids, such as water, clear fruit juice, black coffee, and plain tea.  Eating and drinking restrictions Follow instructions from your health care provider about eating and drinking, which may include:  8 hours before the procedure - stop eating heavy meals or foods such as meat, fried foods, or fatty foods.  6 hours before the procedure - stop eating light meals or foods, such as toast or cereal.  6 hours before the procedure - stop drinking milk or drinks that contain milk.  2 hours before the procedure - stop drinking clear liquids. If  your health care provider told you to take your medicine(s) on the day of your procedure, take them with only a sip of water.  Medicines  Ask your health care provider about: ? Changing or stopping your regular medicines. This is especially important if you are taking diabetes medicines or blood thinners. ? Taking medicines such as aspirin and ibuprofen. These medicines can thin your blood. Do not take these medicines before your procedure if your health care provider instructs you not to.  You may be given antibiotic medicine to help prevent infection. General instructions  For 24 hours before your procedure, do not: ? Douche. ? Use tampons. ? Use medicines, creams, or suppositories in the vagina. ? Have sexual intercourse.  You may be given a pregnancy test on the day of the procedure.  Plan to have someone take you home from the hospital or clinic.  You may have a blood or urine sample taken.  If you will be going home right after the procedure, plan to have someone with you for 24 hours. What happens during the procedure?  To reduce your risk of infection: ? Your health care team will wash or sanitize their hands. ?  Your skin will be washed with soap.  An IV tube will be inserted into one of your veins.  You will be given one of the following: ? A medicine that numbs the area in and around the cervix (local anesthetic). ? A medicine to make you fall asleep (general anesthetic).  You will lie down on your back, with your feet in foot rests (stirrups).  The size and position of your uterus will be checked.  A lubricated instrument (speculum or Sims retractor) will be inserted into the back side of your vagina. The speculum will be used to hold apart the walls of your vagina so your health care provider can see your cervix.  A tool (tenaculum) will be attached to the lip of the cervix to stabilize it.  Your cervix will be softened and dilated. This may be done  by: ? Taking a medicine. ? Having tapered dilators or thin rods (laminaria) or gradual widening instruments (tapered dilators) inserted into your cervix.  A small, sharp, curved instrument (curette) will be used to scrape a small amount of tissue or cells from the endometrium or cervical canal. In some cases, gentle suction is applied with the curette. The curette will then be removed. The cells will be taken to a lab for testing. The procedure may vary among health care providers and hospitals. What happens after the procedure?  You may have mild cramping, backache, pain, and light bleeding or spotting. You may pass small blood clots from your vagina.  You may have to wear compression stockings. These stockings help to prevent blood clots and reduce swelling in your legs.  Your blood pressure, heart rate, breathing rate, and blood oxygen level will be monitored until the medicines you were given have worn off. Summary  Dilation and curettage (D&C) involves stretching (dilation) the cervix and scraping (curettage) the inside lining of the uterus (endometrium).  After the procedure, you may have mild cramping, backache, pain, and light bleeding or spotting. You may pass small blood clots from your vagina.  Plan to have someone take you home from the hospital or clinic. This information is not intended to replace advice given to you by your health care provider. Make sure you discuss any questions you have with your health care provider. Document Released: 04/28/2005 Document Revised: 01/13/2016 Document Reviewed: 01/13/2016 Elsevier Interactive Patient Education  2018 [employer].   Post Anesthesia Home Care Instructions  Activity: Get plenty of rest for the remainder of the day. A responsible individual must stay with you for 24 hours following the procedure.  For the next 24 hours, DO NOT: -Drive a car -Advertising copywriter -Drink alcoholic beverages -Take any medication unless  instructed by your physician -Make any legal decisions or sign important papers.  Meals: Start with liquid foods such as gelatin or soup. Progress to regular foods as tolerated. Avoid greasy, spicy, heavy foods. If nausea and/or vomiting occur, drink only clear liquids until the nausea and/or vomiting subsides. Call your physician if vomiting continues.  Special Instructions/Symptoms: Your throat may feel dry or sore from the anesthesia or the breathing tube placed in your throat during surgery. If this causes discomfort, gargle with warm salt water. The discomfort should disappear within 24 hours.  If you had a scopolamine patch placed behind your ear for the management of post- operative nausea and/or vomiting:  1. The medication in the patch is effective for 72 hours, after which it should be removed.  Wrap patch in a tissue and  discard in the trash. Wash hands thoroughly with soap and water. 2. You may remove the patch earlier than 72 hours if you experience unpleasant side effects which may include dry mouth, dizziness or visual disturbances. 3. Avoid touching the patch. Wash your hands with soap and water after contact with the patch.

## 2018-02-27 ENCOUNTER — Encounter (HOSPITAL_COMMUNITY): Payer: Self-pay | Admitting: Obstetrics and Gynecology

## 2018-02-27 LAB — URINE CULTURE: Culture: NO GROWTH

## 2018-03-04 ENCOUNTER — Telehealth: Payer: Self-pay | Admitting: Student

## 2018-03-04 DIAGNOSIS — A749 Chlamydial infection, unspecified: Secondary | ICD-10-CM

## 2018-03-04 MED ORDER — AZITHROMYCIN 500 MG PO TABS: 1000.0000 mg | ORAL_TABLET | Freq: Once | ORAL | 0 refills | Status: AC

## 2018-03-04 NOTE — Telephone Encounter (Addendum)
Jeanette [name] tested positive for  Chlamydia. Patient was called by RN and allergies and pharmacy confirmed. Rx sent to pharmacy of choice.   Judeth Horn, NP 03/04/2018 4:50 PM        ----- Message from Kathe Becton, RN sent at 03/04/2018  3:44 PM EDT ----- This patient tested positive for :  chlamydia  She has NKDA", I have informed the patient of her results and confirmed her pharmacy is correct in her chart. Please send Rx.   Thank you,   Kathe Becton, RN   Results faxed to [facility].

## 2018-03-18 ENCOUNTER — Telehealth: Payer: Self-pay | Admitting: *Deleted

## 2018-03-18 NOTE — Telephone Encounter (Signed)
Received a voice message from patient stating she is calling back, she missed our call. I called her and informed her I do not see that clinical staff has called her; but I do see she has appointment at [facility] and that may have been the call. She states someone called her about medicine for treatment for chlamydia but she went to pharmacy twice and it wasn't there.  After I verified pharmacy I informed her I would call and make sure they have the order- and she should wait 3 hours before she goes to pick up. She voices understanding. I called the pharmacy and verified she had not picked it up- they stated they received order but did not have her insurance on file and they will get  It ready - she can pay for it or bring insurance card

## 2018-03-29 ENCOUNTER — Encounter: Payer: Self-pay | Admitting: Obstetrics and Gynecology

## 2018-03-29 ENCOUNTER — Ambulatory Visit (INDEPENDENT_AMBULATORY_CARE_PROVIDER_SITE_OTHER): Payer: 59 | Admitting: Obstetrics and Gynecology

## 2018-03-29 VITALS — BP 104/69 | HR 62 | Wt 137.5 lb

## 2018-03-29 DIAGNOSIS — Z9889 Other specified postprocedural states: Secondary | ICD-10-CM

## 2018-03-29 DIAGNOSIS — Z3202 Encounter for pregnancy test, result negative: Secondary | ICD-10-CM | POA: Diagnosis not present

## 2018-03-29 LAB — POCT URINE PREGNANCY: PREG TEST UR: NEGATIVE

## 2018-03-29 MED ORDER — NORELGESTROMIN-ETH ESTRADIOL 150-35 MCG/24HR TD PTWK: 1.0000 | MEDICATED_PATCH | TRANSDERMAL | 12 refills | Status: AC

## 2018-03-29 NOTE — Progress Notes (Signed)
22 yo P0010 here for post op check. Patient had a D&E on 02/26/18 for the treatment of missed abortion. Patient reports feeling well since her procedure. She denies any pelvic pain or abnormal discharge. She was treated for chlamydia on 11/7.  Past Medical History:  Diagnosis Date  . Medical history non-contributory    Past Surgical History:  Procedure Laterality Date  . DILATION AND EVACUATION N/A 02/26/2018   Procedure: DILATATION AND EVACUATION;  Surgeon: Catalina Antiguaonstant, Hye Trawick, MD;  Location: [facility];  Service: Gynecology;  Laterality: N/A;   History reviewed. No pertinent family history. Social History   Tobacco Use  . Smoking status: Never Smoker  . Smokeless tobacco: Never Used  Substance Use Topics  . Alcohol use: Never    Frequency: Never  . Drug use: Never   ROS See pertinent in HPI  Blood pressure 104/69, pulse 62, weight 137 lb 8 oz (62.4 kg), last menstrual period 03/24/2018, unknown if currently breastfeeding.  GENERAL: Well-developed, well-nourished female in no acute distress.  ABDOMEN: Soft, nontender, nondistended. No organomegaly. PELVIC: Not indicated EXTREMITIES: No cyanosis, clubbing, or edema, 2+ distal pulses.  A/P 22 yo P0010 s/p D&E for missed abortion here for post op check - Patient is medically cleared to resume all activities of daily living - Discussed contraception options with the patient and she choose the patch - RTC in August for annual exam with pap smear or prn

## 2018-03-29 NOTE — Progress Notes (Signed)
[facility] is feeling well.  Denies IC since the procedure. She requests to start the patch.

## 2020-09-30 IMAGING — US US OB < 14 WEEKS - US OB TV
1 series · 14 of 28 positions shown · non-contrast
Comparison: None.

CLINICAL DATA: Vaginal bleeding with cramping

EXAM:
OBSTETRIC <14 WK US AND TRANSVAGINAL OB US
TECHNIQUE: Both transabdominal and transvaginal ultrasound examinations were
performed for complete evaluation of the gestation as well as the
maternal uterus, adnexal regions, and pelvic cul-de-sac.
Transvaginal technique was performed to assess early pregnancy.

[Series 1: us ob < 14 weeks - us ob tv · 0.23mm/px · 14 of 53 slices shown]
[im 2/53]
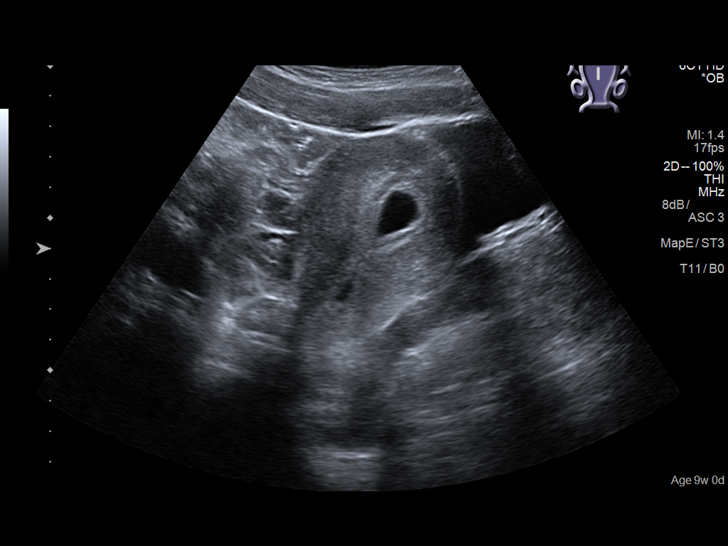
[im 6/53]
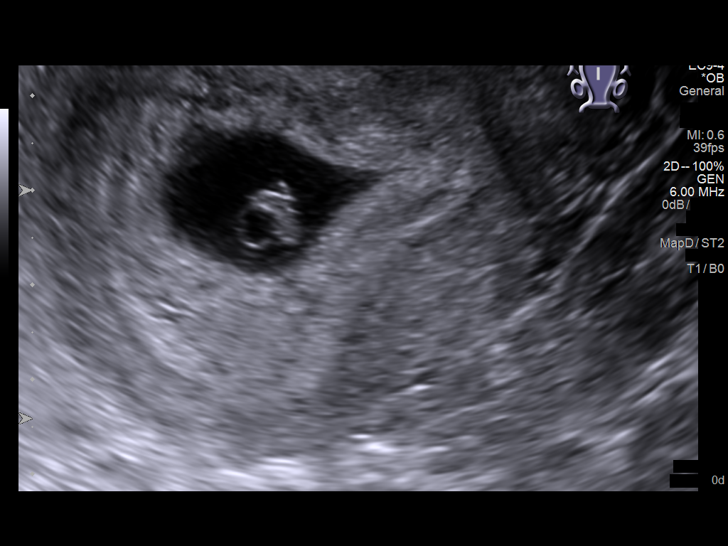
[im 10/53]
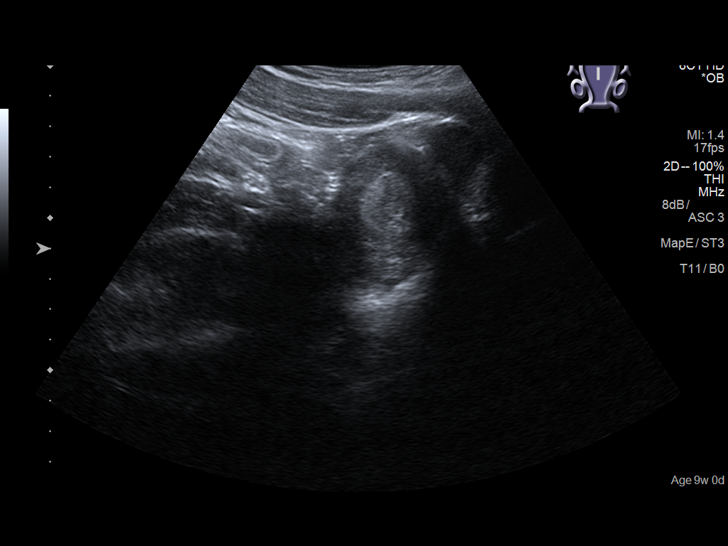
[im 14/53]
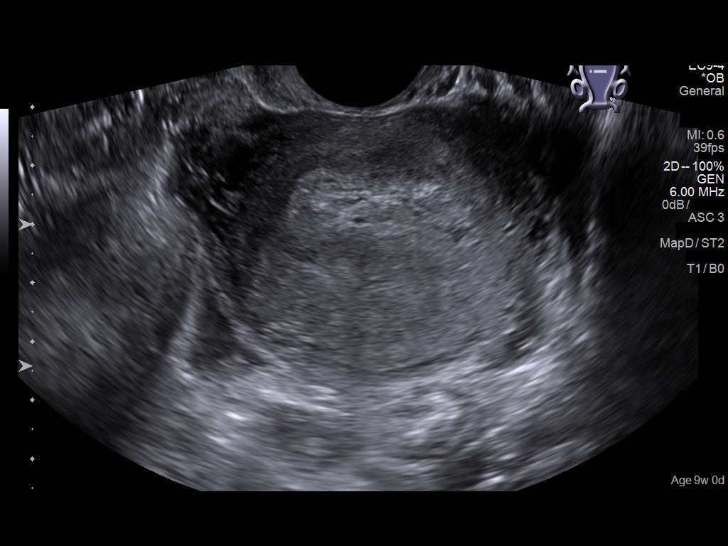
[im 18/53]
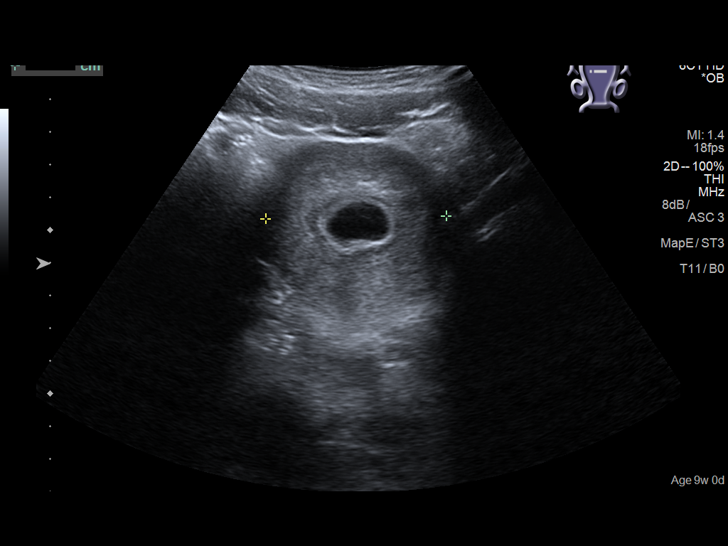
[im 22/53]
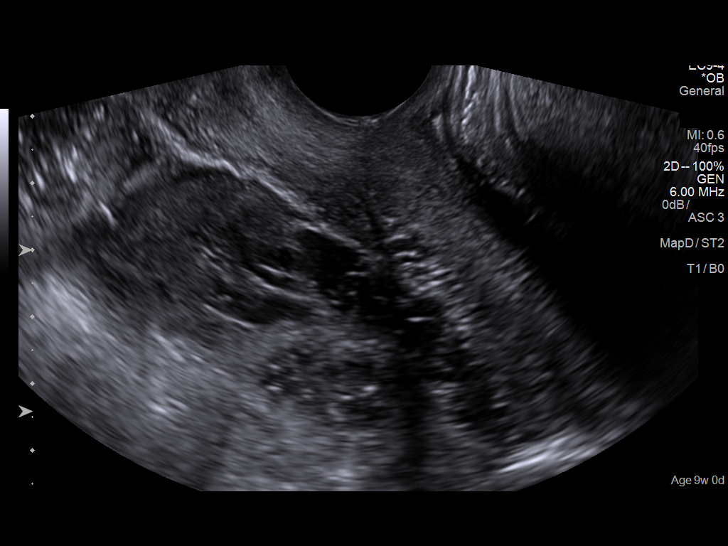
[im 26/53]
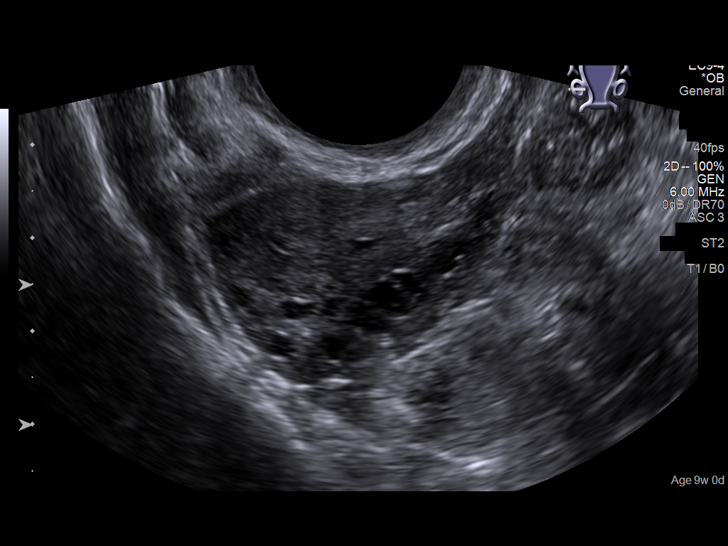
[im 29/53]
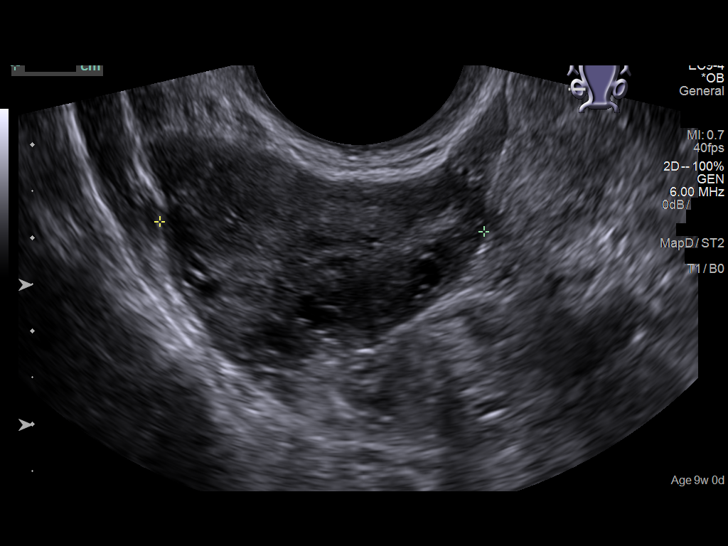
[im 33/53]
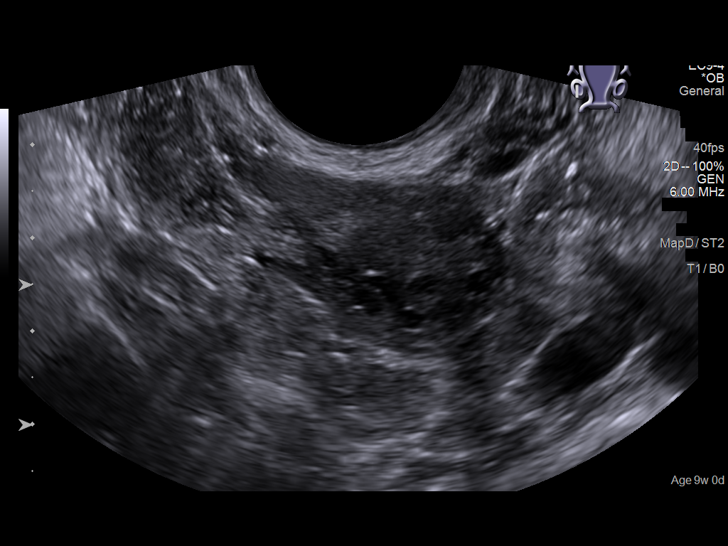
[im 37/53]
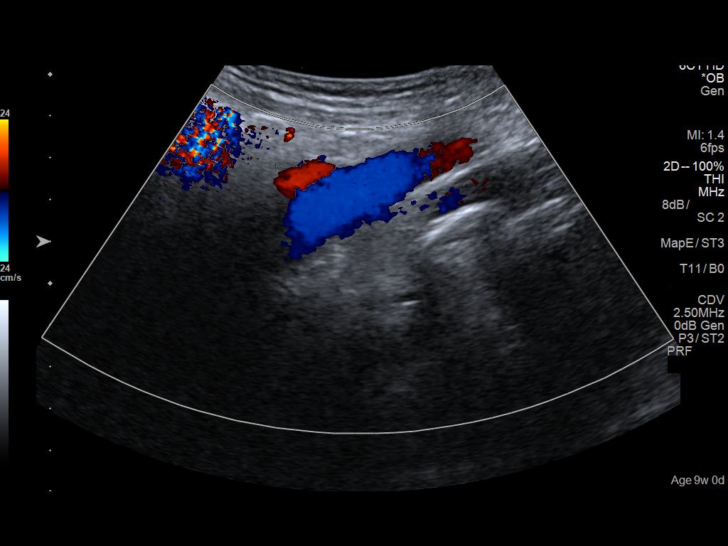
[im 41/53]
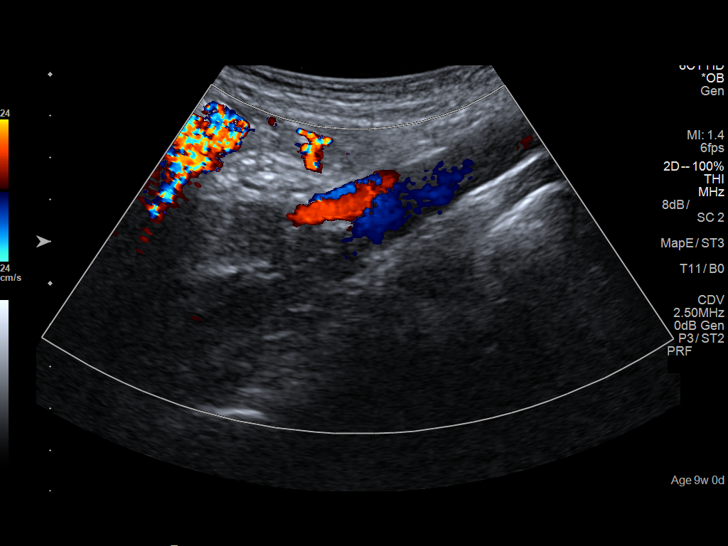
[im 45/53]
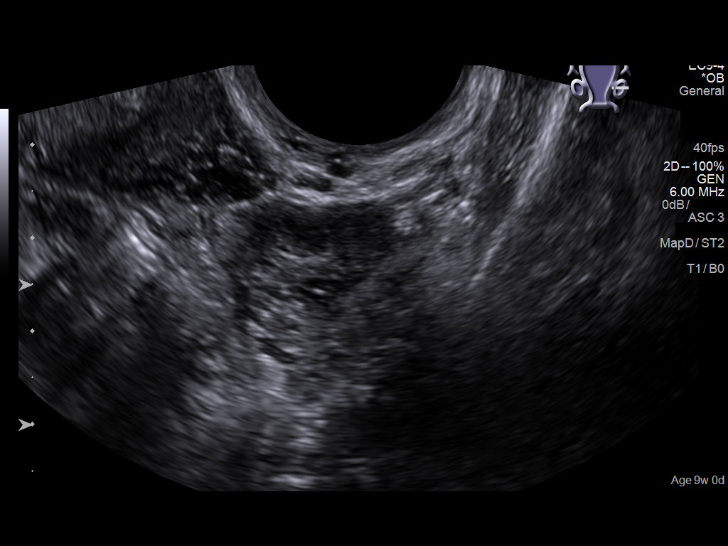
[im 49/53]
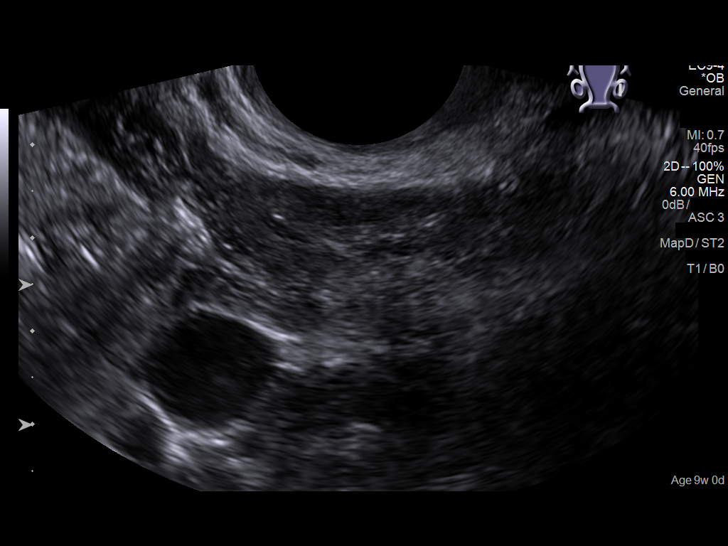
[im 53/53]
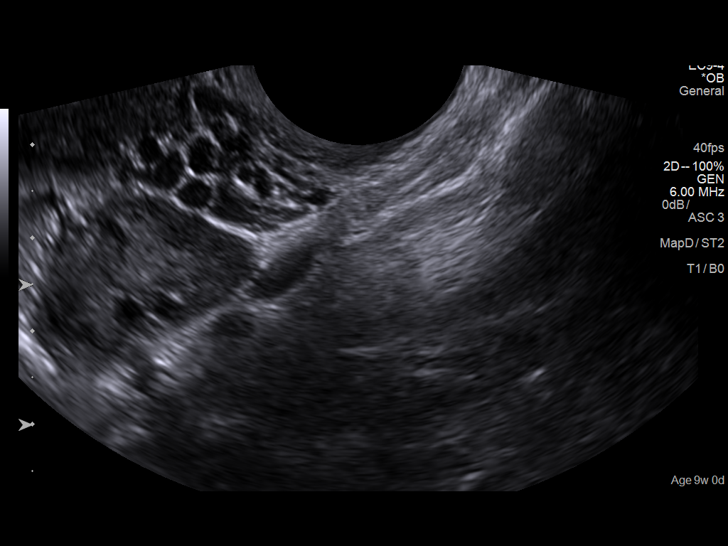

[14 of 28 positions shown; findings below may reference images not displayed]

FINDINGS: Intrauterine gestational sac: Visible

Yolk sac:  Visible

Embryo:  Visible

Cardiac Activity: Not visible

CRL: 7 mm   6 w   4 d

Subchorionic hemorrhage:  None visualized.

Maternal uterus/adnexae: Ovaries are within normal limits. The left
ovary measures 3.3 x 1.5 by 2 cm. The right ovary measures 3.5 by
3.2 x 2 cm. No significant free fluid.
IMPRESSION: Single intrauterine pregnancy with average crown-rump length of 7 mm
and no documented cardiac activity. Findings meet definitive
criteria for failed pregnancy. This follows SRU consensus
guidelines: Diagnostic Criteria for Nonviable Pregnancy Early in the
First Trimester. N Engl J Med 7655;[DATE].
